# Patient Record
Sex: Female | Born: 1950 | Race: Black or African American | Hispanic: No | Marital: Single | Smoking: Current every day smoker
Health system: Southern US, Community
[De-identification: ages and names within clinical notes are randomized; demographics above are authoritative.]

## PROBLEM LIST (undated history)

## (undated) DIAGNOSIS — A159 Respiratory tuberculosis unspecified: Secondary | ICD-10-CM

## (undated) DIAGNOSIS — I10 Essential (primary) hypertension: Secondary | ICD-10-CM

## (undated) DIAGNOSIS — G629 Polyneuropathy, unspecified: Secondary | ICD-10-CM

## (undated) DIAGNOSIS — T7840XA Allergy, unspecified, initial encounter: Secondary | ICD-10-CM

## (undated) DIAGNOSIS — E119 Type 2 diabetes mellitus without complications: Secondary | ICD-10-CM

## (undated) HISTORY — DX: Allergy, unspecified, initial encounter: T78.40XA

## (undated) HISTORY — DX: Respiratory tuberculosis unspecified: A15.9

## (undated) HISTORY — PX: WISDOM TOOTH EXTRACTION: SHX21

## (undated) HISTORY — DX: Polyneuropathy, unspecified: G62.9

## (undated) HISTORY — DX: Essential (primary) hypertension: I10

## (undated) HISTORY — DX: Type 2 diabetes mellitus without complications: E11.9

---

## 2018-05-06 LAB — HM MAMMOGRAPHY: HM Mammogram: NORMAL (ref 0–4)

## 2018-05-06 LAB — HM PAP SMEAR

## 2019-12-29 LAB — HM DIABETES EYE EXAM

## 2020-01-31 ENCOUNTER — Ambulatory Visit (INDEPENDENT_AMBULATORY_CARE_PROVIDER_SITE_OTHER): Payer: Medicare (Managed Care) | Admitting: Internal Medicine

## 2020-01-31 ENCOUNTER — Other Ambulatory Visit: Payer: Self-pay

## 2020-01-31 ENCOUNTER — Encounter (INDEPENDENT_AMBULATORY_CARE_PROVIDER_SITE_OTHER): Payer: Self-pay

## 2020-01-31 ENCOUNTER — Encounter: Payer: Self-pay | Admitting: Internal Medicine

## 2020-01-31 VITALS — BP 180/88 | HR 107 | Temp 98.3°F | Resp 16 | Ht 63.0 in | Wt 141.0 lb

## 2020-01-31 DIAGNOSIS — E538 Deficiency of other specified B group vitamins: Secondary | ICD-10-CM | POA: Insufficient documentation

## 2020-01-31 DIAGNOSIS — I10 Essential (primary) hypertension: Secondary | ICD-10-CM

## 2020-01-31 DIAGNOSIS — E0840 Diabetes mellitus due to underlying condition with diabetic neuropathy, unspecified: Secondary | ICD-10-CM | POA: Insufficient documentation

## 2020-01-31 DIAGNOSIS — E1149 Type 2 diabetes mellitus with other diabetic neurological complication: Secondary | ICD-10-CM | POA: Diagnosis not present

## 2020-01-31 DIAGNOSIS — G8929 Other chronic pain: Secondary | ICD-10-CM

## 2020-01-31 DIAGNOSIS — R7989 Other specified abnormal findings of blood chemistry: Secondary | ICD-10-CM

## 2020-01-31 DIAGNOSIS — E785 Hyperlipidemia, unspecified: Secondary | ICD-10-CM | POA: Diagnosis not present

## 2020-01-31 DIAGNOSIS — Z1211 Encounter for screening for malignant neoplasm of colon: Secondary | ICD-10-CM | POA: Insufficient documentation

## 2020-01-31 DIAGNOSIS — R9431 Abnormal electrocardiogram [ECG] [EKG]: Secondary | ICD-10-CM

## 2020-01-31 DIAGNOSIS — F17200 Nicotine dependence, unspecified, uncomplicated: Secondary | ICD-10-CM

## 2020-01-31 DIAGNOSIS — M25511 Pain in right shoulder: Secondary | ICD-10-CM

## 2020-01-31 DIAGNOSIS — Z0001 Encounter for general adult medical examination with abnormal findings: Secondary | ICD-10-CM

## 2020-01-31 DIAGNOSIS — Z1231 Encounter for screening mammogram for malignant neoplasm of breast: Secondary | ICD-10-CM | POA: Insufficient documentation

## 2020-01-31 LAB — CBC WITH DIFFERENTIAL/PLATELET
Basophils Absolute: 0 10*3/uL (ref 0.0–0.1)
Basophils Relative: 0.5 % (ref 0.0–3.0)
Eosinophils Absolute: 0 10*3/uL (ref 0.0–0.7)
Eosinophils Relative: 0.3 % (ref 0.0–5.0)
HCT: 39.2 % (ref 36.0–46.0)
Hemoglobin: 13.1 g/dL (ref 12.0–15.0)
Lymphocytes Relative: 37.2 % (ref 12.0–46.0)
Lymphs Abs: 2 10*3/uL (ref 0.7–4.0)
MCHC: 33.4 g/dL (ref 30.0–36.0)
MCV: 91.1 fl (ref 78.0–100.0)
Monocytes Absolute: 0.7 10*3/uL (ref 0.1–1.0)
Monocytes Relative: 12.4 % — ABNORMAL HIGH (ref 3.0–12.0)
Neutro Abs: 2.7 10*3/uL (ref 1.4–7.7)
Neutrophils Relative %: 49.6 % (ref 43.0–77.0)
Platelets: 293 10*3/uL (ref 150.0–400.0)
RBC: 4.31 Mil/uL (ref 3.87–5.11)
RDW: 13.9 % (ref 11.5–15.5)
WBC: 5.4 10*3/uL (ref 4.0–10.5)

## 2020-01-31 LAB — URINALYSIS, ROUTINE W REFLEX MICROSCOPIC
Bilirubin Urine: NEGATIVE
Hgb urine dipstick: NEGATIVE
Ketones, ur: NEGATIVE
Leukocytes,Ua: NEGATIVE
Nitrite: NEGATIVE
RBC / HPF: NONE SEEN (ref 0–?)
Specific Gravity, Urine: 1.025 (ref 1.000–1.030)
Total Protein, Urine: NEGATIVE
Urine Glucose: 100 — AB
Urobilinogen, UA: 1 (ref 0.0–1.0)
pH: 6.5 (ref 5.0–8.0)

## 2020-01-31 LAB — FOLATE: Folate: 15 ng/mL (ref >5.9)

## 2020-01-31 LAB — LIPID PANEL
Cholesterol: 253 mg/dL — ABNORMAL HIGH (ref 0–200)
HDL: 34.5 mg/dL — ABNORMAL LOW (ref >39.00)
LDL Cholesterol: 185 mg/dL — ABNORMAL HIGH (ref 0–99)
NonHDL: 218.31
Total CHOL/HDL Ratio: 7
Triglycerides: 168 mg/dL — ABNORMAL HIGH (ref 0.0–149.0)
VLDL: 33.6 mg/dL (ref 0.0–40.0)

## 2020-01-31 LAB — HEPATIC FUNCTION PANEL
ALT: 61 U/L — ABNORMAL HIGH (ref 0–35)
AST: 47 U/L — ABNORMAL HIGH (ref 0–37)
Albumin: 4.3 g/dL (ref 3.5–5.2)
Alkaline Phosphatase: 66 U/L (ref 39–117)
Bilirubin, Direct: 0.1 mg/dL (ref 0.0–0.3)
Total Bilirubin: 0.3 mg/dL (ref 0.2–1.2)
Total Protein: 7.8 g/dL (ref 6.0–8.3)

## 2020-01-31 LAB — VITAMIN B12: Vitamin B-12: 1291 pg/mL — ABNORMAL HIGH (ref 211–911)

## 2020-01-31 LAB — BASIC METABOLIC PANEL
BUN: 12 mg/dL (ref 6–23)
CO2: 26 mEq/L (ref 19–32)
Calcium: 9.7 mg/dL (ref 8.4–10.5)
Chloride: 102 mEq/L (ref 96–112)
Creatinine, Ser: 0.67 mg/dL (ref 0.40–1.20)
GFR: 90.64 mL/min (ref >60.00)
Glucose, Bld: 218 mg/dL — ABNORMAL HIGH (ref 70–99)
Potassium: 3.9 mEq/L (ref 3.5–5.1)
Sodium: 138 mEq/L (ref 135–145)

## 2020-01-31 LAB — MICROALBUMIN / CREATININE URINE RATIO
Creatinine,U: 108.5 mg/dL
Microalb Creat Ratio: 1.9 mg/g (ref 0.0–30.0)
Microalb, Ur: 2.1 mg/dL — ABNORMAL HIGH (ref 0.0–1.9)

## 2020-01-31 LAB — VITAMIN D 25 HYDROXY (VIT D DEFICIENCY, FRACTURES): VITD: 20.98 ng/mL — ABNORMAL LOW (ref 30.00–100.00)

## 2020-01-31 LAB — HEMOGLOBIN A1C: Hgb A1c MFr Bld: 8 % — ABNORMAL HIGH (ref 4.6–6.5)

## 2020-01-31 LAB — TSH: TSH: 1.46 u[IU]/mL (ref 0.35–4.50)

## 2020-01-31 MED ORDER — ROSUVASTATIN CALCIUM 20 MG PO TABS: 20.0000 mg | ORAL_TABLET | Freq: Every day | ORAL | 1 refills | Status: AC

## 2020-01-31 NOTE — Progress Notes (Signed)
Subjective:  Patient ID: Angela Klein, female    DOB: 10-Apr-1950  Age: 69 y.o. MRN: [id number]  CC: Annual Exam, Hypertension, Diabetes, and Hyperlipidemia  This visit occurred during the SARS-CoV-2 public health emergency.  Safety protocols were in place, including screening questions prior to the visit, additional usage of staff PPE, and extensive cleaning of exam room while observing appropriate contact time as indicated for disinfecting solutions.   NEW TO ME  HPI Angela Klein presents for a CPX.  She recently moved from [place] to [place] to live with her daughter.  She tells me she has a history of hypertension and diabetes mellitus.  She does not have any medical records with her.  She tells me she is compliant with the listed medications.  She does not check her blood pressure or her blood sugar.  She smokes cigarettes and drink alcohol.  She is active and denies any recent episodes of chest pain, shortness of breath, palpitations, edema, or fatigue.  She denies polys.  She complains of chronic right shoulder pain.  She denies any recent trauma or injury.  She tells me she is not taking any medications for this.  She tells me she has a history of neuropathy and B12 deficiency.  She complains of burning discomfort with numbness and tingling in her hands and feet.  History [place] has a past medical history of Allergy, Diabetes mellitus without complication (HCC), Hypertension, Neuropathy, and Tuberculosis.   She has a past surgical history that includes Wisdom tooth extraction.   Her family history includes Anuerysm in her sister; Arthritis in her brother, brother, brother, father, mother, sister, and sister; Asthma in her daughter, sister, and sister; Cancer in her sister; Diabetes in her sister; Heart disease in her brother, father, and mother; Heart failure in her brother; Hypertension in her brother.She reports that she has been smoking cigarettes. She started  smoking about 41 years ago. She has a 10.00 pack-year smoking history. She has never used smokeless tobacco. She reports current alcohol use of about 3.0 standard drinks of alcohol per week. She reports that she does not use drugs.   Past Medical History:  Diagnosis Date  . Allergy   . Diabetes mellitus without complication (HCC)   . Hypertension   . Neuropathy   . Tuberculosis    Past Surgical History:  Procedure Laterality Date  . WISDOM TOOTH EXTRACTION      reports that she has been smoking cigarettes. She started smoking about 41 years ago. She has a 10.00 pack-year smoking history. She has never used smokeless tobacco. She reports current alcohol use of about 3.0 standard drinks of alcohol per week. She reports that she does not use drugs. family history includes Anuerysm in her sister; Arthritis in her brother, brother, brother, father, mother, sister, and sister; Asthma in her daughter, sister, and sister; Cancer in her sister; Diabetes in her sister; Heart disease in her brother, father, and mother; Heart failure in her brother; Hypertension in her brother. Allergies  Allergen Reactions  . Bee Pollen   . Dust Mite Extract    Outpatient Medications Prior to Visit  Medication Sig Dispense Refill  . aspirin 81 MG chewable tablet Chew by mouth daily.    . B Complex-C-Folic Acid (SUPER B COMPLEX/FA/VIT C PO) Take by mouth.    . Cyanocobalamin (B-12) 3000 MCG CAPS Take by mouth.    . gabapentin (NEURONTIN) 300 MG capsule Take 300 mg by mouth 3 (three) times daily.    Marland Kitchen  Multiple Vitamin (MULTIVITAMIN ADULT PO) Take by mouth.    . metFORMIN (GLUCOPHAGE) 1000 MG tablet Take 1,000 mg by mouth 2 (two) times daily with a meal.    . simvastatin (ZOCOR) 20 MG tablet Take 20 mg by mouth daily.     No facility-administered medications prior to visit.    ROS Review of Systems  Constitutional: Negative for appetite change, chills, diaphoresis, fatigue and fever.  HENT: Negative.   Eyes:  Negative.   Respiratory: Negative for cough, chest tightness, shortness of breath and wheezing.   Cardiovascular: Negative for chest pain, palpitations and leg swelling.  Gastrointestinal: Negative for abdominal pain, constipation, diarrhea, nausea and vomiting.  Endocrine: Negative.  Negative for polydipsia, polyphagia and polyuria.  Genitourinary: Negative.  Negative for difficulty urinating, dysuria, hematuria and urgency.  Musculoskeletal: Negative.  Negative for arthralgias, joint swelling and myalgias.  Skin: Negative.  Negative for color change, pallor and rash.  Neurological: Positive for numbness. Negative for dizziness, weakness and light-headedness.  Hematological: Negative for adenopathy. Does not bruise/bleed easily.  Psychiatric/Behavioral: Negative.     Objective:  BP (!) 180/88   Pulse (!) 107   Temp 98.3 F (36.8 C) (Oral)   Resp 16   Ht 5\' 3"  (1.6 m)   Wt 141 lb (64 kg)   LMP 02/19/2004   SpO2 97%   BMI 24.98 kg/m   Physical Exam Vitals reviewed.  HENT:     Nose: Nose normal.     Mouth/Throat:     Mouth: Mucous membranes are moist.  Eyes:     General: No scleral icterus.    Conjunctiva/sclera: Conjunctivae normal.  Cardiovascular:     Rate and Rhythm: Regular rhythm. Tachycardia present.     Heart sounds: Normal heart sounds, S1 normal and S2 normal. No murmur heard. No gallop.      Comments: EKG -  Sinus tachycardia, 101 bpm TWI in III No LVH No Q waves No old EKG's for comparison Pulmonary:     Effort: Pulmonary effort is normal.     Breath sounds: No stridor. No wheezing, rhonchi or rales.  Abdominal:     General: Abdomen is flat.     Palpations: There is no mass.     Tenderness: There is no abdominal tenderness. There is no guarding.     Hernia: No hernia is present.  Musculoskeletal:        General: Normal range of motion.     Cervical back: Neck supple.     Right lower leg: No edema.     Left lower leg: No edema.  Lymphadenopathy:      Cervical: No cervical adenopathy.  Skin:    General: Skin is warm and dry.     Coloration: Skin is not pale.  Neurological:     General: No focal deficit present.     Mental Status: She is alert and oriented to person, place, and time. Mental status is at baseline.  Psychiatric:        Mood and Affect: Mood normal.        Behavior: Behavior normal.     Lab Results  Component Value Date   WBC 5.4 01/31/2020   HGB 13.1 01/31/2020   HCT 39.2 01/31/2020   PLT 293.0 01/31/2020   GLUCOSE 218 (H) 01/31/2020   CHOL 253 (H) 01/31/2020   TRIG 168.0 (H) 01/31/2020   HDL 34.50 (L) 01/31/2020   LDLCALC 185 (H) 01/31/2020   ALT 61 (H) 01/31/2020   AST 47 (  H) 01/31/2020   NA 138 01/31/2020   K 3.9 01/31/2020   CL 102 01/31/2020   CREATININE 0.67 01/31/2020   BUN 12 01/31/2020   CO2 26 01/31/2020   TSH 1.46 01/31/2020   HGBA1C 8.0 (H) 01/31/2020   MICROALBUR 2.1 (H) 01/31/2020    Assessment & Plan:   Azjah was seen today for annual exam, hypertension, diabetes and hyperlipidemia.  Diagnoses and all orders for this visit:  Encounter for general adult medical examination with abnormal findings- Exam completed, labs reviewed, she refused vaccines against tetanus, pneumnoia, and influenza.  Cancer screenings addressed. Patient education was given.  I have asked her to have medical records forwarded to me.  Diabetes mellitus type 2 with neurological manifestations (HCC)- Her A1c is up to 8.0%.  I recommended that she add an SGLT2 inhibitor to Metformin. -     HM Diabetes Foot Exam -     Basic metabolic panel; Future -     Hemoglobin A1c; Future -     Microalbumin / creatinine urine ratio; Future -     Microalbumin / creatinine urine ratio -     Hemoglobin A1c -     Basic metabolic panel -     Dapagliflozin-metFORMIN HCl ER (XIGDUO XR) 11-998 MG TB24; Take 1 tablet by mouth daily.  Primary hypertension- Her blood pressure is not adequately well controlled.  Her EKG is negative  for LVH.  Her labs are negative for secondary causes or endorgan damage.  I recommended that she treat this with nebivolol and indapamide. -     TSH; Future -     Urinalysis, Routine w reflex microscopic; Future -     VITAMIN D 25 Hydroxy (Vit-D Deficiency, Fractures); Future -     EKG 12-Lead -     VITAMIN D 25 Hydroxy (Vit-D Deficiency, Fractures) -     TSH -     Urinalysis, Routine w reflex microscopic -     nebivolol (BYSTOLIC) 5 MG tablet; Take 1 tablet (5 mg total) by mouth daily. -     indapamide (LOZOL) 1.25 MG tablet; Take 1 tablet (1.25 mg total) by mouth daily.  Hyperlipidemia with target LDL less than 100- She has not achieved her LDL goal despite taking simvastatin.  I recommended that she upgrade to a more potent statin. -     Lipid panel; Future -     Hepatic function panel; Future -     Hepatic function panel -     Lipid panel -     rosuvastatin (CRESTOR) 20 MG tablet; Take 1 tablet (20 mg total) by mouth daily.  Dietary B12 deficiency- Her H&H, B12, and folate levels are normal now. -     CBC with Differential/Platelet; Future -     Vitamin B12; Future -     Folate; Future -     Folate -     Vitamin B12 -     CBC with Differential/Platelet  Visit for screening mammogram -     MM DIGITAL SCREENING BILATERAL; Future  Screen for colon cancer  Chronic right shoulder pain -     Ambulatory referral to Sports Medicine  Mild tobacco abuse  Colon cancer screening -     Cologuard  Elevated LFTs- I have asked her to return to be screened for viral hepatitis.  I have also recommended an ultrasound of the liver to see if she has NASH. -     US Abdomen Limited RUQ (LIVER/GB); Future  Abnormal electrocardiogram (ECG) (EKG)- She has no signs of ischemia though she is very stoic.  She has a very high ASCVD risk score and a slightly abnormal EKG.  I recommended that she undergo a myocardial perfusion imaging to screen for ischemia. -     MYOCARDIAL PERFUSION IMAGING;  Future  Abnormal electrocardiogram -     MYOCARDIAL PERFUSION IMAGING; Future   I have discontinued Kimberlee Macrae's metFORMIN and simvastatin. I am also having her start on rosuvastatin, nebivolol, indapamide, and Xigduo XR. Additionally, I am having her maintain her gabapentin, aspirin, B-12, B Complex-C-Folic Acid (SUPER B COMPLEX/FA/VIT C PO), and Multiple Vitamin (MULTIVITAMIN ADULT PO).  Meds ordered this encounter  Medications  . rosuvastatin (CRESTOR) 20 MG tablet    Sig: Take 1 tablet (20 mg total) by mouth daily.    Dispense:  90 tablet    Refill:  1  . nebivolol (BYSTOLIC) 5 MG tablet    Sig: Take 1 tablet (5 mg total) by mouth daily.    Dispense:  90 tablet    Refill:  1  . indapamide (LOZOL) 1.25 MG tablet    Sig: Take 1 tablet (1.25 mg total) by mouth daily.    Dispense:  90 tablet    Refill:  0  . Dapagliflozin-metFORMIN HCl ER (XIGDUO XR) 11-998 MG TB24    Sig: Take 1 tablet by mouth daily.    Dispense:  90 tablet    Refill:  0   In addition to time spent on CPE, I spent 50 minutes in preparing to see the patient by review of recent labs, imaging and procedures, obtaining and reviewing separately obtained history, communicating with the patient and family or caregiver, ordering medications, tests or procedures, and documenting clinical information in the EHR including the differential Dx, treatment, and any further evaluation and other management of 1. Diabetes mellitus type 2 with neurological manifestations (HCC) 2. Primary hypertension 3. Hyperlipidemia with target LDL less than 100 4. Dietary B12 deficiency 5. Chronic right shoulder pain 6. Elevated LFTs 7. Abnormal electrocardiogram (ECG) (EKG) 8. Abnormal electrocardiogram     Follow-up: Return in about 6 weeks (around 03/13/2020).  Sanda Linger, MD

## 2020-01-31 NOTE — Patient Instructions (Signed)
Health Maintenance, Female Adopting a healthy lifestyle and getting preventive care are important in promoting health and wellness. Ask your health care provider about:  The right schedule for you to have regular tests and exams.  Things you can do on your own to prevent diseases and keep yourself healthy. What should I know about diet, weight, and exercise? Eat a healthy diet   Eat a diet that includes plenty of vegetables, fruits, low-fat dairy products, and lean protein.  Do not eat a lot of foods that are high in solid fats, added sugars, or sodium. Maintain a healthy weight Body mass index (BMI) is used to identify weight problems. It estimates body fat based on height and weight. Your health care provider can help determine your BMI and help you achieve or maintain a healthy weight. Get regular exercise Get regular exercise. This is one of the most important things you can do for your health. Most adults should:  Exercise for at least 150 minutes each week. The exercise should increase your heart rate and make you sweat (moderate-intensity exercise).  Do strengthening exercises at least twice a week. This is in addition to the moderate-intensity exercise.  Spend less time sitting. Even light physical activity can be beneficial. Watch cholesterol and blood lipids Have your blood tested for lipids and cholesterol at 67 years of age, then have this test every 5 years. Have your cholesterol levels checked more often if:  Your lipid or cholesterol levels are high.  You are older than 67 years of age.  You are at high risk for heart disease. What should I know about cancer screening? Depending on your health history and family history, you may need to have cancer screening at various ages. This may include screening for:  Breast cancer.  Cervical cancer.  Colorectal cancer.  Skin cancer.  Lung cancer. What should I know about heart disease, diabetes, and high blood  pressure? Blood pressure and heart disease  High blood pressure causes heart disease and increases the risk of stroke. This is more likely to develop in people who have high blood pressure readings, are of African descent, or are overweight.  Have your blood pressure checked: ? Every 3-5 years if you are 18-39 years of age. ? Every year if you are 40 years old or older. Diabetes Have regular diabetes screenings. This checks your fasting blood sugar level. Have the screening done:  Once every three years after age 40 if you are at a normal weight and have a low risk for diabetes.  More often and at a younger age if you are overweight or have a high risk for diabetes. What should I know about preventing infection? Hepatitis B If you have a higher risk for hepatitis B, you should be screened for this virus. Talk with your health care provider to find out if you are at risk for hepatitis B infection. Hepatitis C Testing is recommended for:  Everyone born from 1945 through 1965.  Anyone with known risk factors for hepatitis C. Sexually transmitted infections (STIs)  Get screened for STIs, including gonorrhea and chlamydia, if: ? You are sexually active and are younger than 67 years of age. ? You are older than 67 years of age and your health care provider tells you that you are at risk for this type of infection. ? Your sexual activity has changed since you were last screened, and you are at increased risk for chlamydia or gonorrhea. Ask your health care provider if   you are at risk.  Ask your health care provider about whether you are at high risk for HIV. Your health care provider may recommend a prescription medicine to help prevent HIV infection. If you choose to take medicine to prevent HIV, you should first get tested for HIV. You should then be tested every 3 months for as long as you are taking the medicine. Pregnancy  If you are about to stop having your period (premenopausal) and  you may become pregnant, seek counseling before you get pregnant.  Take 400 to 800 micrograms (mcg) of folic acid every day if you become pregnant.  Ask for birth control (contraception) if you want to prevent pregnancy. Osteoporosis and menopause Osteoporosis is a disease in which the bones lose minerals and strength with aging. This can result in bone fractures. If you are 65 years old or older, or if you are at risk for osteoporosis and fractures, ask your health care provider if you should:  Be screened for bone loss.  Take a calcium or vitamin D supplement to lower your risk of fractures.  Be given hormone replacement therapy (HRT) to treat symptoms of menopause. Follow these instructions at home: Lifestyle  Do not use any products that contain nicotine or tobacco, such as cigarettes, e-cigarettes, and chewing tobacco. If you need help quitting, ask your health care provider.  Do not use street drugs.  Do not share needles.  Ask your health care provider for help if you need support or information about quitting drugs. Alcohol use  Do not drink alcohol if: ? Your health care provider tells you not to drink. ? You are pregnant, may be pregnant, or are planning to become pregnant.  If you drink alcohol: ? Limit how much you use to 0-1 drink a day. ? Limit intake if you are breastfeeding.  Be aware of how much alcohol is in your drink. In the [place] one drink equals one 12 oz bottle of beer (355 mL), one 5 oz glass of wine (148 mL), or one 1 oz glass of hard liquor (44 mL). General instructions  Schedule regular health, dental, and eye exams.  Stay current with your vaccines.  Tell your health care provider if: ? You often feel depressed. ? You have ever been abused or do not feel safe at home. Summary  Adopting a healthy lifestyle and getting preventive care are important in promoting health and wellness.  Follow your health care provider's instructions about healthy  diet, exercising, and getting tested or screened for diseases.  Follow your health care provider's instructions on monitoring your cholesterol and blood pressure. This information is not intended to replace advice given to you by your health care provider. Make sure you discuss any questions you have with your health care provider. Document Revised: 01/28/2018 Document Reviewed: 01/28/2018 Elsevier Patient Education  2020 Else[employer] Inc.  

## 2020-02-01 MED ORDER — XIGDUO XR 10-1000 MG PO TB24: 1.0000 | ORAL_TABLET | Freq: Every day | ORAL | 0 refills | Status: DC

## 2020-02-01 MED ORDER — INDAPAMIDE 1.25 MG PO TABS: 1.2500 mg | ORAL_TABLET | Freq: Every day | ORAL | 0 refills | Status: AC

## 2020-02-01 MED ORDER — NEBIVOLOL HCL 5 MG PO TABS
5.0000 mg | ORAL_TABLET | Freq: Every day | ORAL | 1 refills | Status: DC
Start: 2020-02-01 — End: 2020-02-24

## 2020-02-03 ENCOUNTER — Encounter: Payer: Self-pay | Admitting: Family Medicine

## 2020-02-07 ENCOUNTER — Telehealth: Payer: Self-pay | Admitting: Pharmacist

## 2020-02-07 NOTE — Telephone Encounter (Signed)
Received request from PCP to contact patient for pharmacy consult.  Reviewed chart for medical history. Patient recently moved here from [place] with her daughter. Angela Klein saw Dr Ronnald Ramp as a new patient on 01/31/20. Angela Klein has regular Medicare and [employer] commercial plan for prescription drug coverage. Currently [facility]. There are no medication fills listed in dispense history in Epic.  Medication changes/additions at recent visit include:  -added Bystolic 5 mg and indapamide 1.25 mg for uncontrolled BP -added Xigduo 11-998 mg daily for A1c of 8.0% -changed simvastatin to rosuvastatin 20 mg for uncontrolled HLD/high ASCVD risk   Attempted to contact patient, left message on mobile phone for her to return call.   Wt Readings from Last 3 Encounters:  01/31/20 141 lb (64 kg)   BP Readings from Last 3 Encounters:  01/31/20 (!) 180/88   Pulse Readings from Last 3 Encounters:  01/31/20 (!) 107   Lab Results  Component Value Date/Time   HGBA1C 8.0 (H) 01/31/2020 10:48 AM   Last lipids Lab Results  Component Value Date   CHOL 253 (H) 01/31/2020   HDL 34.50 (L) 01/31/2020   LDLCALC 185 (H) 01/31/2020   TRIG 168.0 (H) 01/31/2020   CHOLHDL 7 01/31/2020   Hepatic Function Latest Ref Rng & Units 01/31/2020  Total Protein 6.0 - 8.3 g/dL 7.8  Albumin 3.5 - 5.2 g/dL 4.3  AST 0 - 37 U/L 47(H)  ALT 0 - 35 U/L 61(H)  Alk Phosphatase 39 - 117 U/L 66  Total Bilirubin 0.2 - 1.2 mg/dL 0.3  Bilirubin, Direct 0.0 - 0.3 mg/dL 0.1   The 10-year ASCVD risk score Mikey Bussing DC Jr., et al., 2013) is: 76.6%   Values used to calculate the score:     Age: 89 years     Sex: Female     Is Non-Hispanic African American: Yes     Diabetic: Yes     Tobacco smoker: Yes     Systolic Blood Pressure: 456 mmHg     Is BP treated: Yes     HDL Cholesterol: 34.5 mg/dL     Total Cholesterol: 253 mg/dL  Allergies  Allergen Reactions  . Bee Pollen   . Dust Mite Extract    Outpatient Encounter  Medications as of 02/07/2020  Medication Sig  . aspirin 81 MG chewable tablet Chew by mouth daily.  . B Complex-C-Folic Acid (SUPER B COMPLEX/FA/VIT C PO) Take by mouth.  . Cyanocobalamin (B-12) 3000 MCG CAPS Take by mouth.  . Dapagliflozin-metFORMIN HCl ER (XIGDUO XR) 11-998 MG TB24 Take 1 tablet by mouth daily.  Marland Kitchen gabapentin (NEURONTIN) 300 MG capsule Take 300 mg by mouth 3 (three) times daily.  . indapamide (LOZOL) 1.25 MG tablet Take 1 tablet (1.25 mg total) by mouth daily.  . Multiple Vitamin (MULTIVITAMIN ADULT PO) Take by mouth.  . nebivolol (BYSTOLIC) 5 MG tablet Take 1 tablet (5 mg total) by mouth daily.  . rosuvastatin (CRESTOR) 20 MG tablet Take 1 tablet (20 mg total) by mouth daily.   No facility-administered encounter medications on file as of 02/07/2020.

## 2020-02-23 ENCOUNTER — Other Ambulatory Visit: Payer: Self-pay | Admitting: Internal Medicine

## 2020-02-23 ENCOUNTER — Telehealth (HOSPITAL_COMMUNITY): Payer: Self-pay

## 2020-02-23 DIAGNOSIS — R9431 Abnormal electrocardiogram [ECG] [EKG]: Secondary | ICD-10-CM

## 2020-02-23 NOTE — Telephone Encounter (Signed)
Spoke with the patient's daughter, detailed instructions given. She stated that she would make sure she is there for test. Asked to call back with any questions. S.Donique Hammonds EMTP

## 2020-02-24 ENCOUNTER — Other Ambulatory Visit: Payer: Self-pay | Admitting: Internal Medicine

## 2020-02-24 ENCOUNTER — Ambulatory Visit
Admission: RE | Admit: 2020-02-24 | Discharge: 2020-02-24 | Disposition: A | Discharge status: Home / Self Care | Payer: Medicare (Managed Care) | Source: Ambulatory Visit | Attending: Internal Medicine | Admitting: Internal Medicine

## 2020-02-24 ENCOUNTER — Telehealth: Payer: Self-pay

## 2020-02-24 ENCOUNTER — Other Ambulatory Visit: Payer: Self-pay

## 2020-02-24 ENCOUNTER — Ambulatory Visit (HOSPITAL_COMMUNITY): Payer: Medicare (Managed Care) | Attending: Internal Medicine

## 2020-02-24 DIAGNOSIS — K769 Liver disease, unspecified: Secondary | ICD-10-CM | POA: Insufficient documentation

## 2020-02-24 DIAGNOSIS — E1140 Type 2 diabetes mellitus with diabetic neuropathy, unspecified: Secondary | ICD-10-CM | POA: Insufficient documentation

## 2020-02-24 DIAGNOSIS — E1149 Type 2 diabetes mellitus with other diabetic neurological complication: Secondary | ICD-10-CM

## 2020-02-24 DIAGNOSIS — R7989 Other specified abnormal findings of blood chemistry: Secondary | ICD-10-CM

## 2020-02-24 DIAGNOSIS — I10 Essential (primary) hypertension: Secondary | ICD-10-CM

## 2020-02-24 DIAGNOSIS — R9431 Abnormal electrocardiogram [ECG] [EKG]: Secondary | ICD-10-CM

## 2020-02-24 LAB — MYOCARDIAL PERFUSION IMAGING
LV dias vol: 57 mL (ref 46–106)
LV sys vol: 19 mL
Peak HR: 116 {beats}/min
Rest HR: 83 {beats}/min
SDS: 7
SRS: 3
SSS: 10
TID: 1.08

## 2020-02-24 MED ORDER — GABAPENTIN 300 MG PO CAPS: 300.0000 mg | ORAL_CAPSULE | Freq: Three times a day (TID) | ORAL | 1 refills | Status: AC

## 2020-02-24 MED ORDER — TECHNETIUM TC 99M TETROFOSMIN IV KIT
10.1000 | PACK | Freq: Once | INTRAVENOUS | Status: AC | PRN
  Administered 2020-02-24: 10.1 via INTRAVENOUS
  Filled 2020-02-24: qty 11

## 2020-02-24 MED ORDER — METFORMIN HCL ER 750 MG PO TB24: 1500.0000 mg | ORAL_TABLET | Freq: Every day | ORAL | 1 refills | Status: AC

## 2020-02-24 MED ORDER — REGADENOSON 0.4 MG/5ML IV SOLN
0.4000 mg | Freq: Once | INTRAVENOUS | Status: AC
  Administered 2020-02-24: 0.4 mg via INTRAVENOUS

## 2020-02-24 MED ORDER — CARVEDILOL 3.125 MG PO TABS: 3.1250 mg | ORAL_TABLET | Freq: Two times a day (BID) | ORAL | 0 refills | Status: AC

## 2020-02-24 MED ORDER — TECHNETIUM TC 99M TETROFOSMIN IV KIT
31.4000 | PACK | Freq: Once | INTRAVENOUS | Status: AC | PRN
  Administered 2020-02-24: 31.4 via INTRAVENOUS
  Filled 2020-02-24: qty 32

## 2020-02-24 NOTE — Telephone Encounter (Signed)
Patient came to office to discuss medications.  She has not picked up any prescriptions yet, she was not aware medications were prescribed (per chart several voicemails were left for patient to inform her)  Discussed new medications at length. [facility]. Pt asked about medication to help with sleep, advised improvements in sleep hygiene first, may conside trazodone in the future. She also asked about gabapentin for "tingling" in her extremities, which she took before and she is now out. Will request rx from PCP.  [facility] to make [facility] would be [facility], also gave them patient's insurance information.   Davonna Belling is $342 ($47 copay + $295 deductible) Bystolic is $80 Generic medications are $10 or less each.  I asked pharmacist not to fill Xigduo due to cost. We will attempt patient assistance through [employer] for this.  Patient will call me if she cannot afford the other medications.

## 2020-02-24 NOTE — Telephone Encounter (Signed)
2. Several solid foci within the liver of uncertain etiology. This finding warrants further evaluation with MR or CT pre and serial post-contrast of the liver to further evaluate.  3. Prominence of the common hepatic duct with slight dilatation of the proximal common bile duct. Much of the common bile duct is obscured by gas. From an imaging standpoint, MRCP would be the optimum imaging study of choice to further assess the biliary ductal system.

## 2020-03-14 ENCOUNTER — Encounter: Payer: Self-pay | Admitting: Internal Medicine

## 2020-03-14 ENCOUNTER — Ambulatory Visit
Admission: RE | Admit: 2020-03-14 | Discharge: 2020-03-14 | Disposition: A | Discharge status: Home / Self Care | Payer: Medicare (Managed Care) | Source: Ambulatory Visit | Attending: Internal Medicine | Admitting: Internal Medicine

## 2020-03-14 ENCOUNTER — Other Ambulatory Visit: Payer: Self-pay

## 2020-03-14 DIAGNOSIS — Z1231 Encounter for screening mammogram for malignant neoplasm of breast: Secondary | ICD-10-CM

## 2020-03-14 LAB — HM MAMMOGRAPHY

## 2020-03-30 ENCOUNTER — Other Ambulatory Visit: Payer: Self-pay | Admitting: Internal Medicine

## 2020-03-30 DIAGNOSIS — R928 Other abnormal and inconclusive findings on diagnostic imaging of breast: Secondary | ICD-10-CM

## 2020-12-18 ENCOUNTER — Telehealth: Payer: Self-pay | Admitting: Internal Medicine

## 2020-12-18 NOTE — Telephone Encounter (Signed)
LVM for pt to rtn my call to schedule AWV with [facility]. Please schedule appt if pt calls the office.  

## 2021-08-04 IMAGING — US US ABDOMEN LIMITED
1 series · 13 of 25 positions shown · non-contrast
Comparison: None.

CLINICAL DATA: Elevated liver enzymes

EXAM:
ULTRASOUND ABDOMEN LIMITED RIGHT UPPER QUADRANT

[Series 1: us abdomen limited · 0.22mm/px · 13 of 76 slices shown]
[im 1/76]
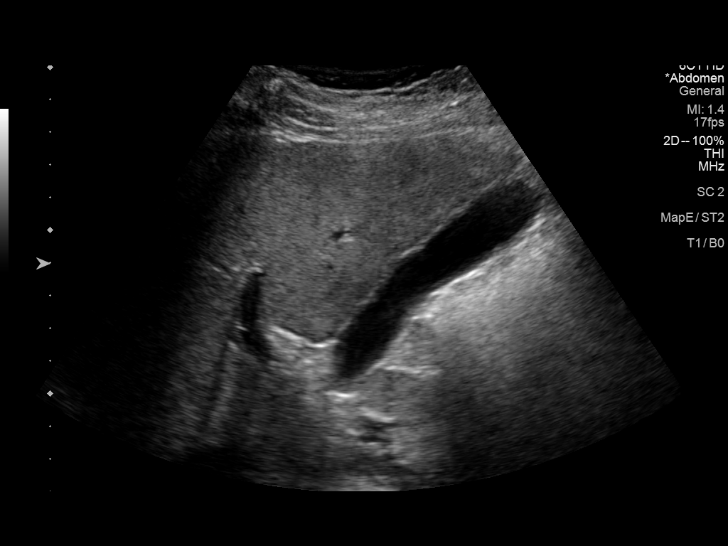
[im 7/76]
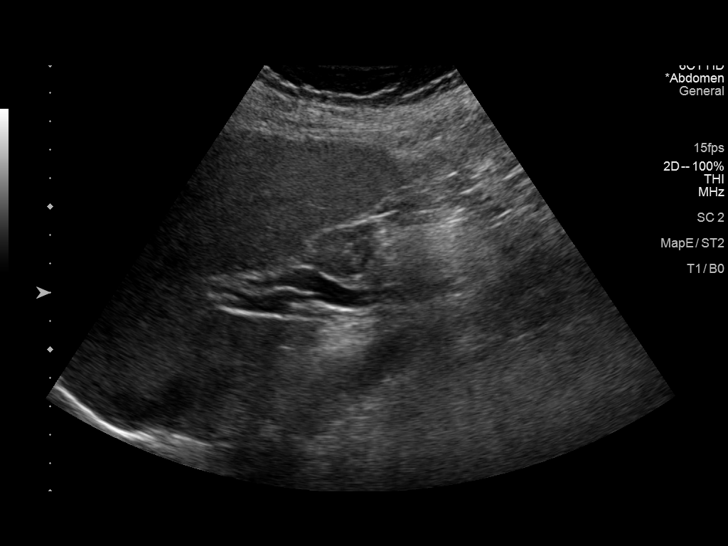
[im 13/76]
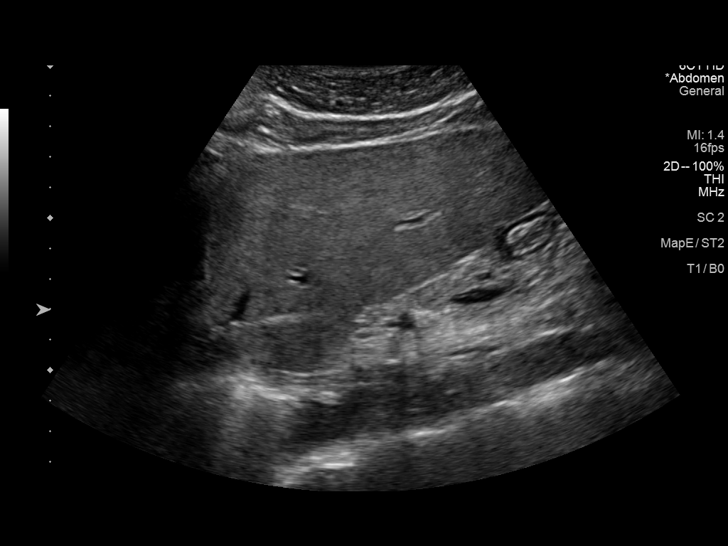
[im 19/76]
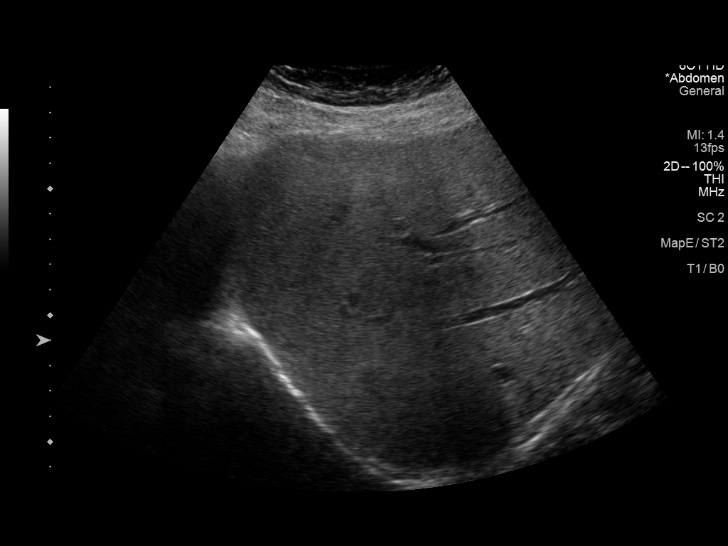
[im 26/76]
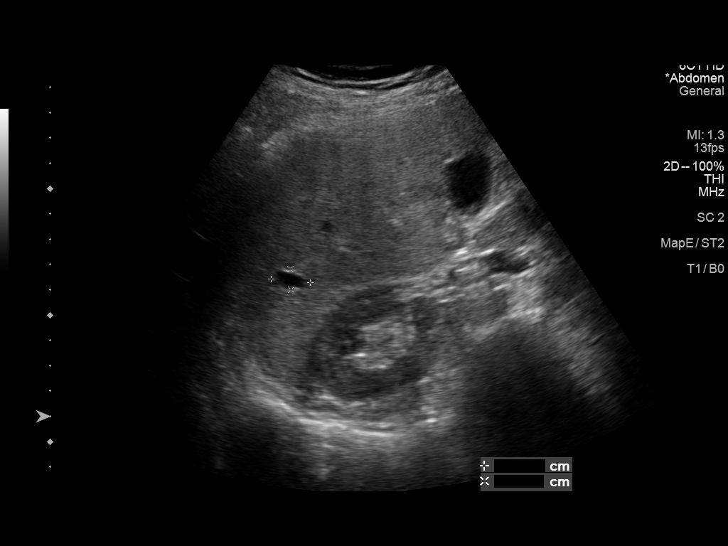
[im 32/76]
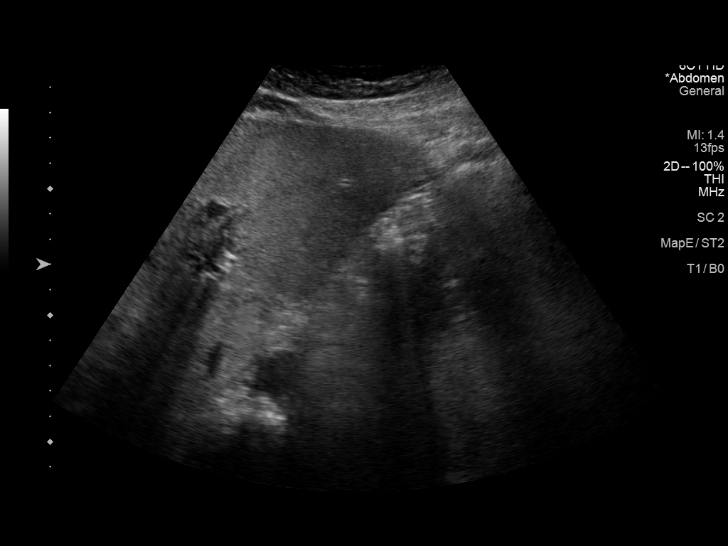
[im 38/76]
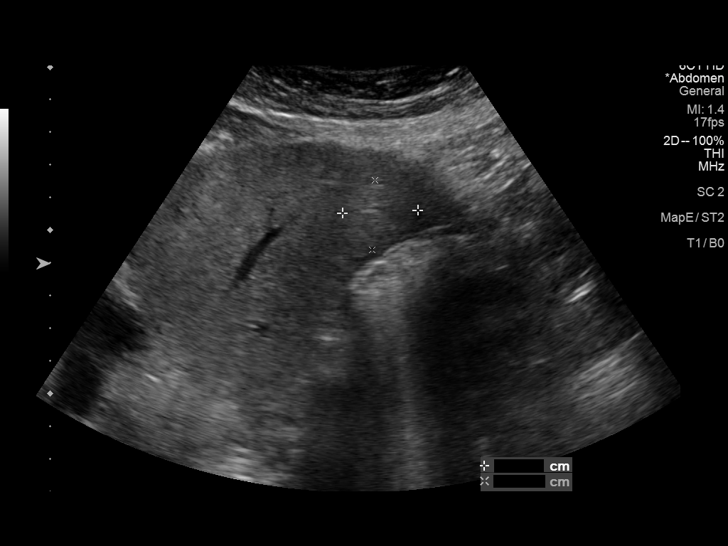
[im 44/76]
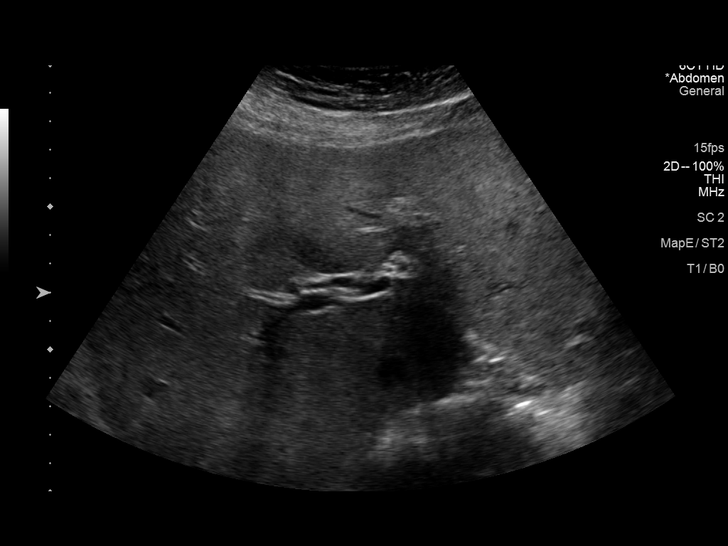
[im 51/76]
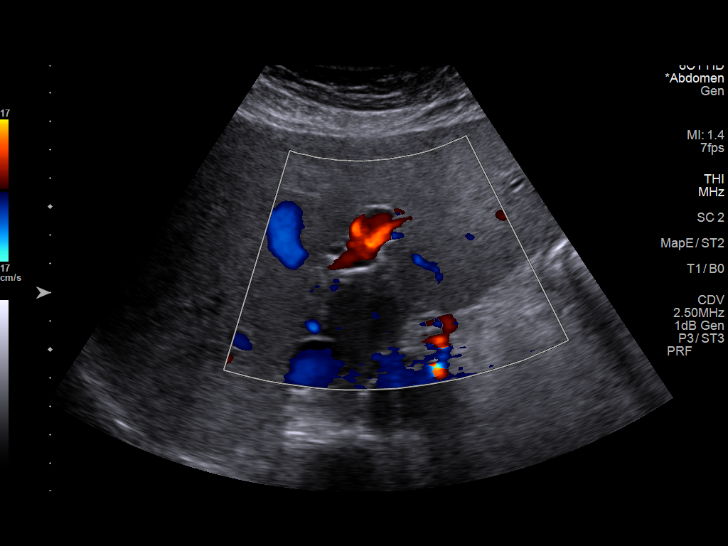
[im 57/76]
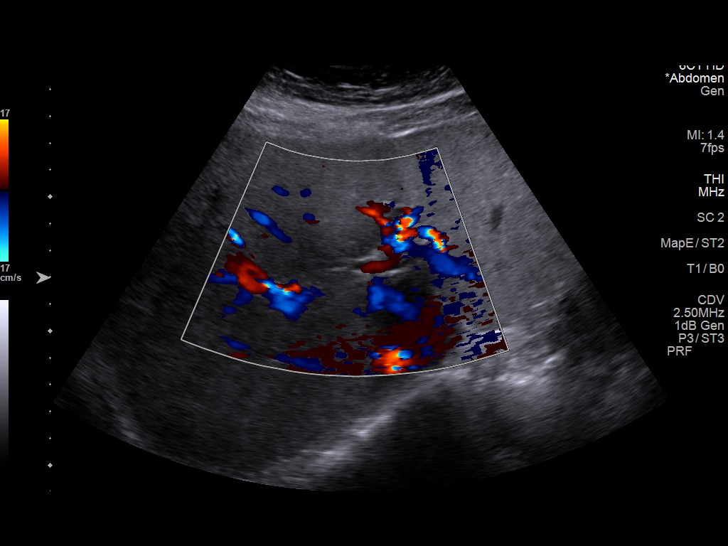
[im 63/76]
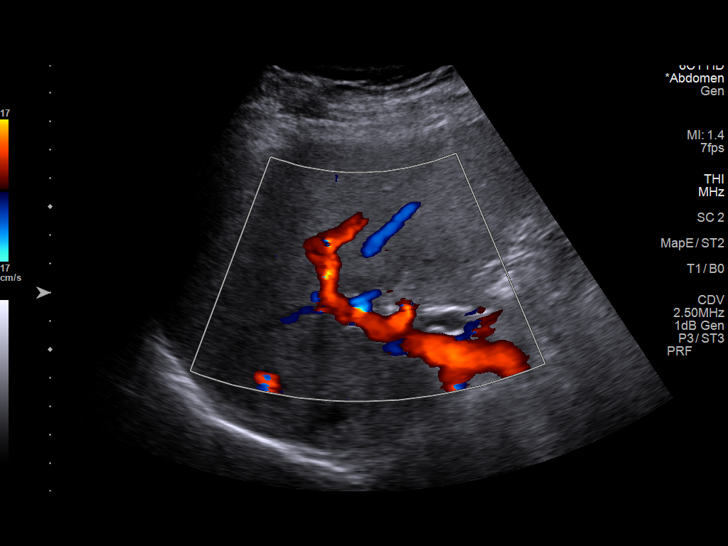
[im 69/76]
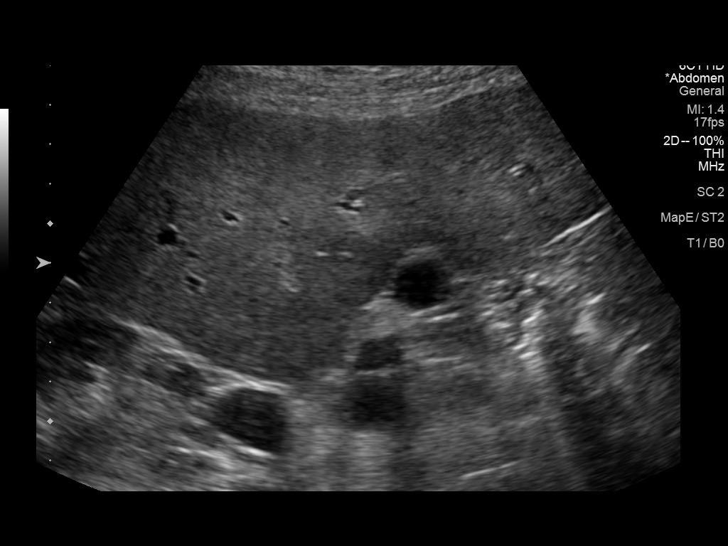
[im 76/76]
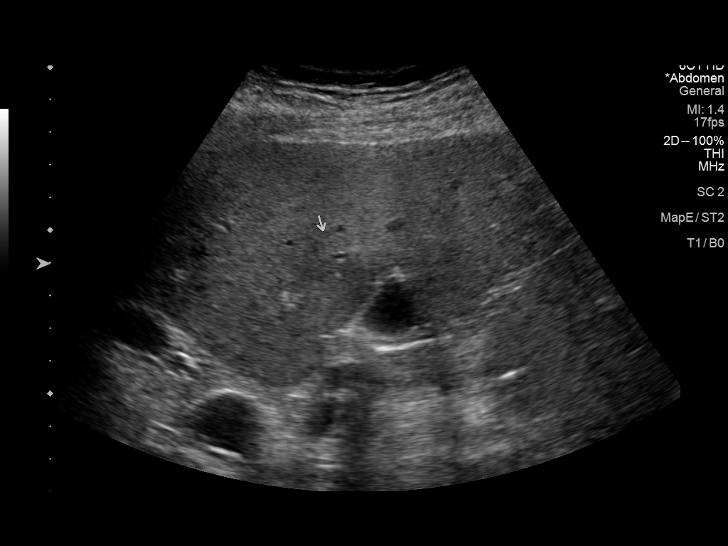

[13 of 25 positions shown; findings below may reference images not displayed]

FINDINGS: Gallbladder:

No gallstones or wall thickening visualized. There is no
pericholecystic fluid. No sonographic Murphy sign noted by
sonographer.

Common bile duct:

Diameter: Common hepatic duct measures 9 mm with mild tapering more
distally. No intrahepatic biliary duct dilatation. Much of the
common bile duct is obscured by gas.

Liver:

There is a small cyst in the right lobe of the liver measuring 1.6 x
0.8 x 1.1 cm. There is a solid mildly hyperechoic area in the left
lobe of the liver measuring 2.3 x 2.1 x 2.5 cm. A hypoechoic nodular
area is noted near the junction of the right and left lobes of the
liver measuring 1.9 x 1.8 x 2.4 cm. A second hypoechoic mass in this
area measures 3.1 x 2.9 4.2 cm. A hypoechoic area is located near
the gallbladder fossa measuring 1.2 x 0.9 x 0.9 cm. Liver contour is
subtly nodular with an overall inhomogeneous echotexture. Portal
vein is patent on color Doppler imaging with normal direction of
blood flow towards the liver.

Other: None.
IMPRESSION: 1. Liver has a subtly nodular contour with inhomogeneous
echotexture, likely due to underlying parenchymal liver
disease/cirrhosis.

2. Several solid foci within the liver of uncertain etiology. This
finding warrants further evaluation with MR or CT pre and serial
post-contrast of the liver to further evaluate.

3. Prominence of the common hepatic duct with slight dilatation of
the proximal common bile duct. Much of the common bile duct is
obscured by gas. From an imaging standpoint, MRCP would be the
optimum imaging study of choice to further assess the biliary ductal
system.

4.  No gallbladder pathology evident.

These results will be called to the ordering clinician or
representative by the Radiologist Assistant, and communication
documented in the PACS or [REDACTED].

## 2021-08-23 IMAGING — MG DIGITAL SCREENING BILAT W/ CAD
4 series · 4 of 4 positions shown · non-contrast
Comparison: None.

CLINICAL DATA: Screening.

EXAM:
DIGITAL SCREENING BILATERAL MAMMOGRAM WITH CAD

[L CC]
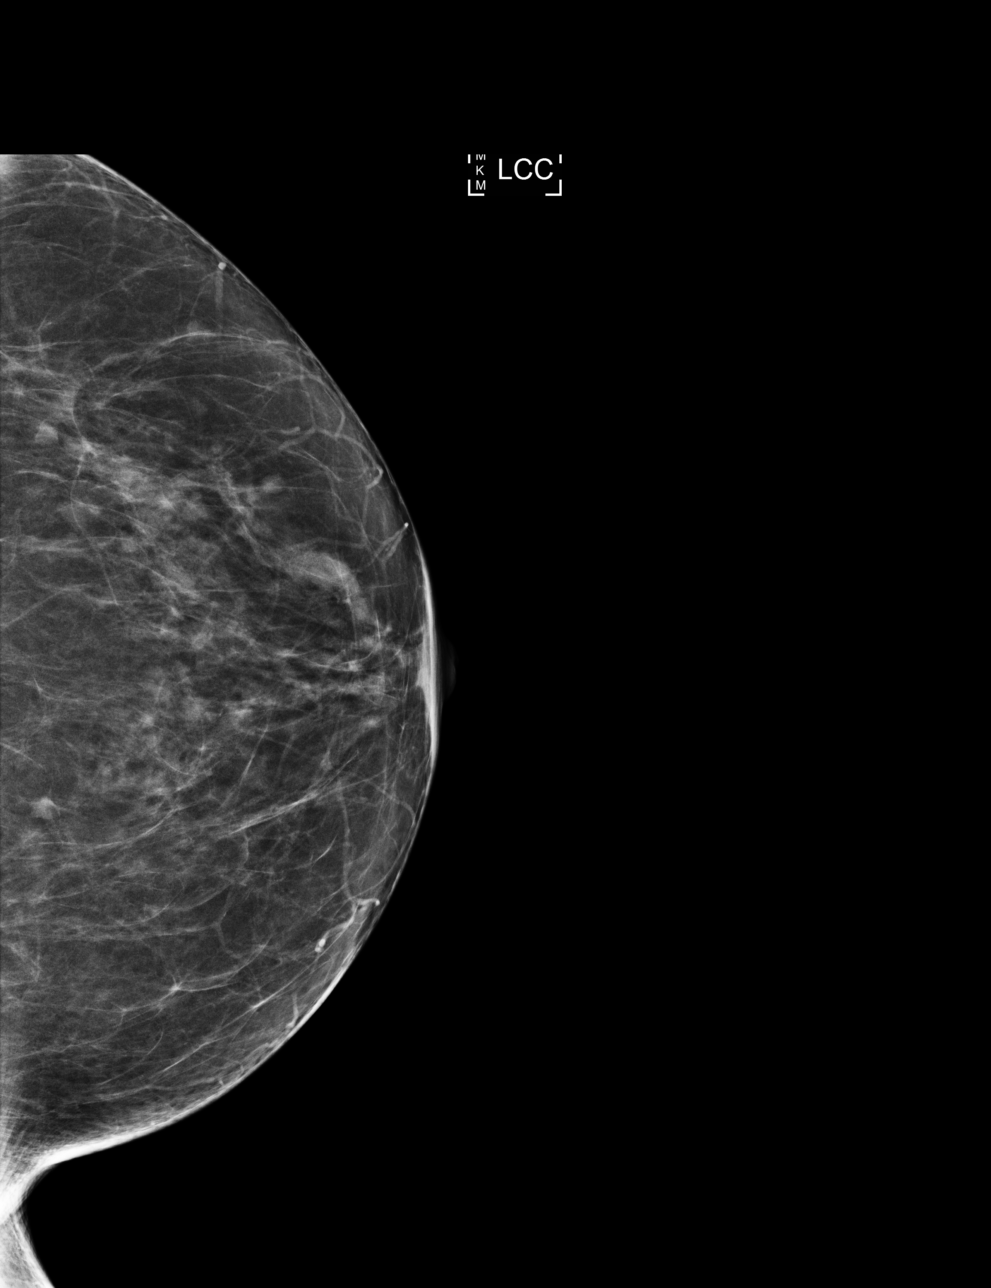

[R MLO]
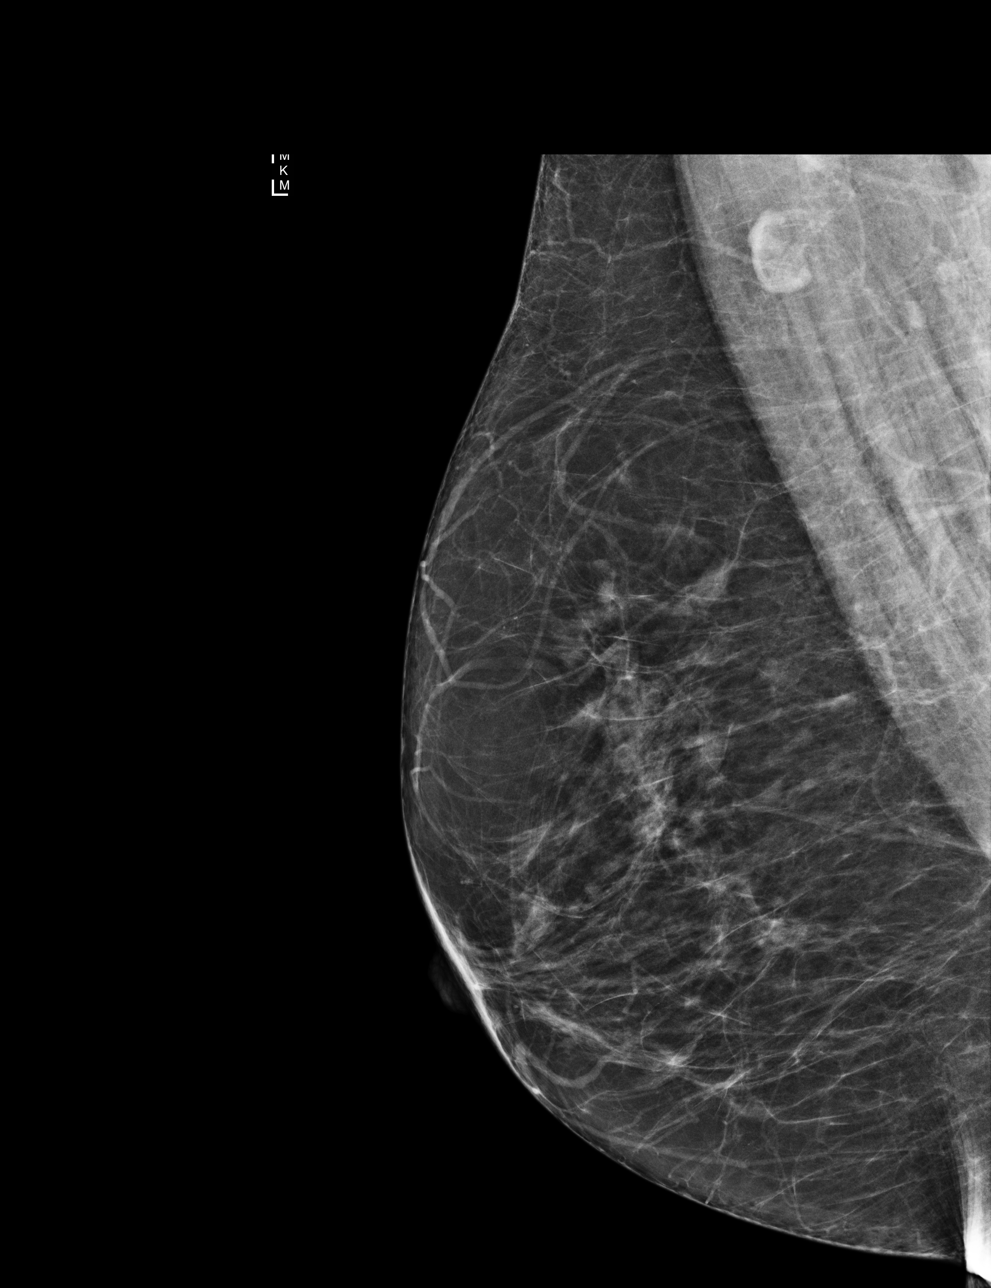

[R CC]
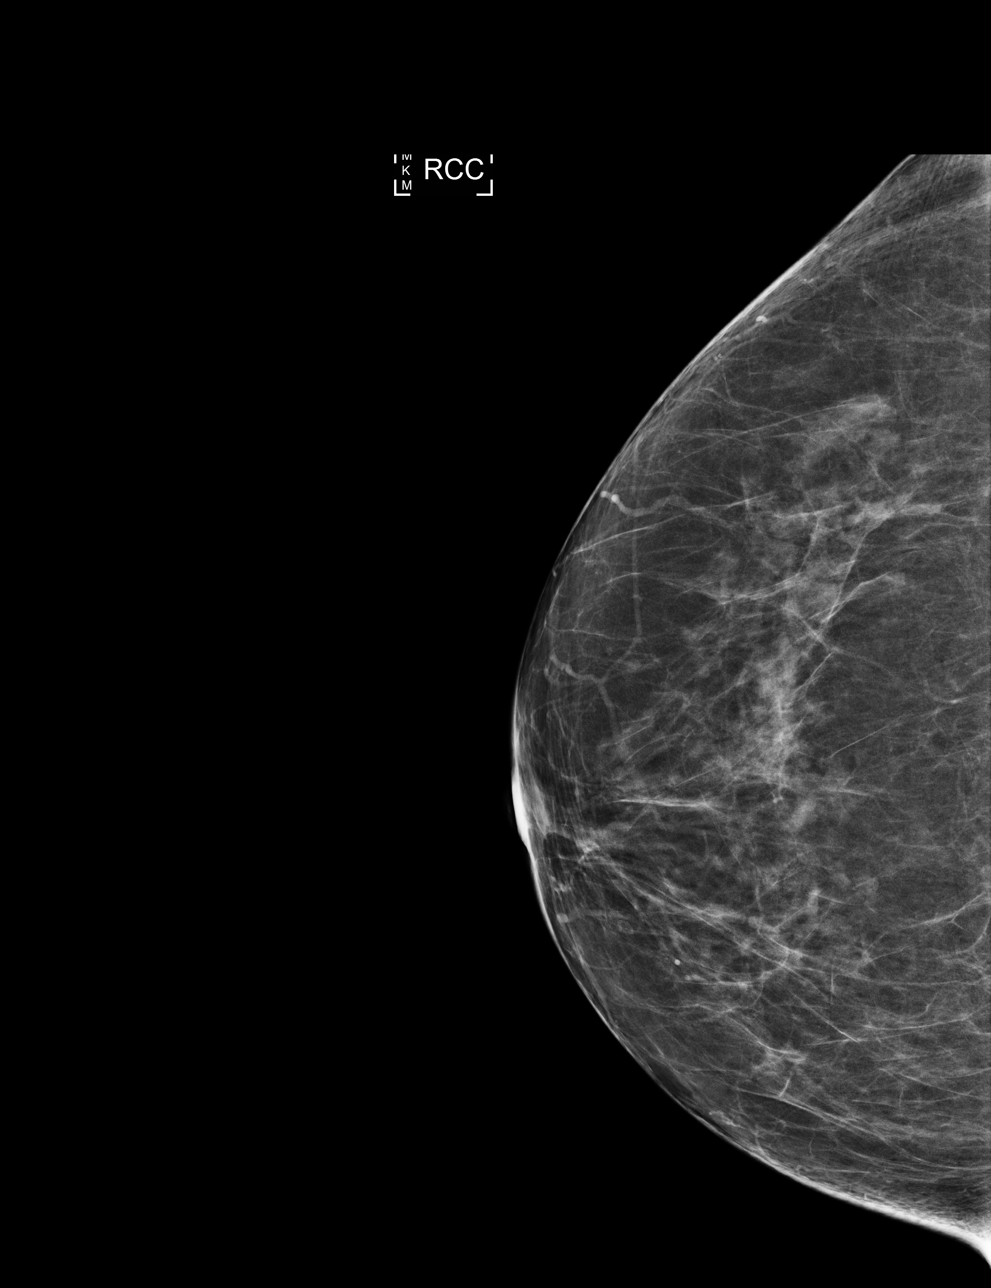

[L MLO]
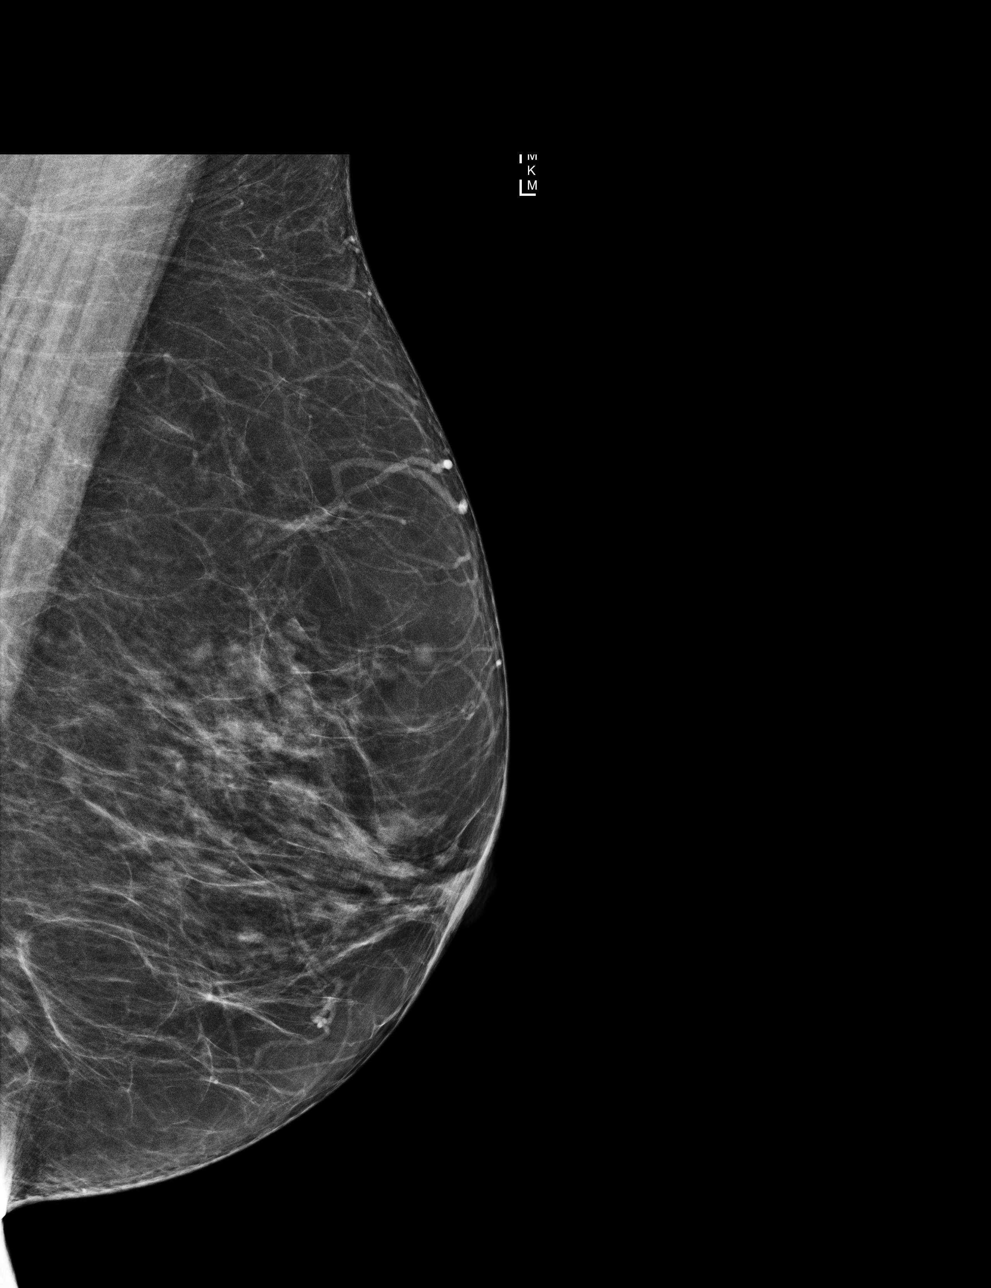

[4 of 4 positions shown; findings below may reference images not displayed]

ACR Breast Density Category b: There are scattered areas of
fibroglandular density.
FINDINGS: In the left breast, a possible mass warrants further evaluation. In
the right breast, no findings suspicious for malignancy.

The images were evaluated with computer-aided detection.
IMPRESSION: Further evaluation is suggested for a possible mass in the left
breast.

RECOMMENDATION:
Diagnostic mammogram and possibly ultrasound of the left breast.
(Code:[id number])

The patient will be contacted regarding the findings, and additional
imaging will be scheduled.

BI-RADS CATEGORY  0: Incomplete. Need additional imaging evaluation
and/or prior mammograms for comparison.

## 2023-03-28 LAB — COLOGUARD: COLOGUARD: NEGATIVE

## 2023-03-28 LAB — EXTERNAL GENERIC LAB PROCEDURE: COLOGUARD: NEGATIVE

## 2023-08-06 ENCOUNTER — Ambulatory Visit: Payer: Medicare (Managed Care) | Admitting: Family Medicine

## 2024-05-06 ENCOUNTER — Ambulatory Visit: Payer: Medicare (Managed Care) | Admitting: Family Medicine
